# Patient Record
Sex: Male | Born: 1990 | Race: White | Hispanic: No | Marital: Married
Health system: Southern US, Community
[De-identification: ages and names within clinical notes are randomized; demographics above are authoritative.]

---

## 2018-09-04 ENCOUNTER — Emergency Department (HOSPITAL_COMMUNITY): Payer: No Typology Code available for payment source

## 2018-09-04 ENCOUNTER — Encounter (HOSPITAL_COMMUNITY): Payer: Self-pay

## 2018-09-04 ENCOUNTER — Other Ambulatory Visit: Payer: Self-pay

## 2018-09-04 ENCOUNTER — Emergency Department (HOSPITAL_COMMUNITY)
Admission: EM | Admit: 2018-09-04 | Discharge: 2018-09-04 | Disposition: A | Discharge status: Home / Self Care | Payer: No Typology Code available for payment source | Attending: Emergency Medicine | Admitting: Emergency Medicine

## 2018-09-04 DIAGNOSIS — R000 Tachycardia, unspecified: Secondary | ICD-10-CM | POA: Diagnosis not present

## 2018-09-04 DIAGNOSIS — S060X0A Concussion without loss of consciousness, initial encounter: Secondary | ICD-10-CM

## 2018-09-04 DIAGNOSIS — Y92410 Unspecified street and highway as the place of occurrence of the external cause: Secondary | ICD-10-CM | POA: Insufficient documentation

## 2018-09-04 DIAGNOSIS — Y999 Unspecified external cause status: Secondary | ICD-10-CM | POA: Insufficient documentation

## 2018-09-04 DIAGNOSIS — M79652 Pain in left thigh: Secondary | ICD-10-CM | POA: Diagnosis not present

## 2018-09-04 DIAGNOSIS — M25562 Pain in left knee: Secondary | ICD-10-CM | POA: Diagnosis not present

## 2018-09-04 DIAGNOSIS — Y9389 Activity, other specified: Secondary | ICD-10-CM | POA: Diagnosis not present

## 2018-09-04 DIAGNOSIS — S0101XA Laceration without foreign body of scalp, initial encounter: Secondary | ICD-10-CM

## 2018-09-04 DIAGNOSIS — M25552 Pain in left hip: Secondary | ICD-10-CM | POA: Diagnosis not present

## 2018-09-04 DIAGNOSIS — S0990XA Unspecified injury of head, initial encounter: Secondary | ICD-10-CM | POA: Diagnosis present

## 2018-09-04 LAB — COMPREHENSIVE METABOLIC PANEL
ALT: 24 U/L (ref 0–44)
AST: 32 U/L (ref 15–41)
Albumin: 4.6 g/dL (ref 3.5–5.0)
Alkaline Phosphatase: 37 U/L — ABNORMAL LOW (ref 38–126)
Anion gap: 13 (ref 5–15)
BUN: 13 mg/dL (ref 6–20)
CO2: 22 mmol/L (ref 22–32)
Calcium: 9.7 mg/dL (ref 8.9–10.3)
Chloride: 103 mmol/L (ref 98–111)
Creatinine, Ser: 0.99 mg/dL (ref 0.61–1.24)
GFR calc Af Amer: >60 mL/min (ref >60)
GFR calc non Af Amer: >60 mL/min (ref >60)
Glucose, Bld: 149 mg/dL — ABNORMAL HIGH (ref 70–99)
Potassium: 3.9 mmol/L (ref 3.5–5.1)
Sodium: 138 mmol/L (ref 135–145)
Total Bilirubin: 0.9 mg/dL (ref 0.3–1.2)
Total Protein: 7.7 g/dL (ref 6.5–8.1)

## 2018-09-04 LAB — CBC
HCT: 41.1 % (ref 39.0–52.0)
Hemoglobin: 14.4 g/dL (ref 13.0–17.0)
MCH: 30.8 pg (ref 26.0–34.0)
MCHC: 35 g/dL (ref 30.0–36.0)
MCV: 87.8 fL (ref 80.0–100.0)
Platelets: 271 10*3/uL (ref 150–400)
RBC: 4.68 MIL/uL (ref 4.22–5.81)
RDW: 11.8 % (ref 11.5–15.5)
WBC: 9.9 10*3/uL (ref 4.0–10.5)
nRBC: 0 % (ref 0.0–0.2)

## 2018-09-04 LAB — ETHANOL: Alcohol, Ethyl (B): <10 mg/dL (ref <10)

## 2018-09-04 MED ORDER — LIDOCAINE-EPINEPHRINE (PF) 2 %-1:200000 IJ SOLN
20.0000 mL | Freq: Once | INTRAMUSCULAR | Status: AC
  Administered 2018-09-04: 20 mL
  Filled 2018-09-04 (×2): qty 20

## 2018-09-04 MED ORDER — LACTATED RINGERS IV BOLUS
1000.0000 mL | Freq: Once | INTRAVENOUS | Status: AC
  Administered 2018-09-04: 1000 mL via INTRAVENOUS

## 2018-09-04 MED ORDER — FENTANYL CITRATE (PF) 100 MCG/2ML IJ SOLN: 50.0000 ug | INTRAMUSCULAR | Status: DC | PRN

## 2018-09-04 MED ORDER — ACETAMINOPHEN 325 MG PO TABS: 650.0000 mg | ORAL_TABLET | Freq: Four times a day (QID) | ORAL | 0 refills | Status: AC | PRN

## 2018-09-04 MED ORDER — IOHEXOL 300 MG/ML  SOLN
100.0000 mL | Freq: Once | INTRAMUSCULAR | Status: AC | PRN
  Administered 2018-09-04: 100 mL via INTRAVENOUS

## 2018-09-04 MED ORDER — OXYCODONE-ACETAMINOPHEN 5-325 MG PO TABS: 1.0000 | ORAL_TABLET | Freq: Three times a day (TID) | ORAL | 0 refills | Status: AC | PRN

## 2018-09-04 MED ORDER — FENTANYL CITRATE (PF) 100 MCG/2ML IJ SOLN
100.0000 ug | Freq: Once | INTRAMUSCULAR | Status: AC
  Administered 2018-09-04: 100 ug via INTRAVENOUS
  Filled 2018-09-04: qty 2

## 2018-09-04 MED ORDER — OXYCODONE-ACETAMINOPHEN 5-325 MG PO TABS
1.0000 | ORAL_TABLET | Freq: Once | ORAL | Status: AC
  Administered 2018-09-04: 1 via ORAL
  Filled 2018-09-04: qty 1

## 2018-09-04 NOTE — ED Notes (Signed)
Pt returned from ct

## 2018-09-04 NOTE — ED Notes (Signed)
Pt uncomfortable and wants to be repositioned but the reading on his c-ts have not been reported yet
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
## 2018-09-04 NOTE — ED Notes (Signed)
Fentanyl offered but pt does not want

## 2018-09-04 NOTE — ED Notes (Signed)
Pt back in c-t
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
## 2019-05-18 ENCOUNTER — Other Ambulatory Visit: Payer: Self-pay | Admitting: Orthopedic Surgery

## 2019-05-18 DIAGNOSIS — M259 Joint disorder, unspecified: Secondary | ICD-10-CM

## 2019-05-29 ENCOUNTER — Other Ambulatory Visit: Payer: Self-pay

## 2019-05-29 ENCOUNTER — Ambulatory Visit
Admission: RE | Admit: 2019-05-29 | Discharge: 2019-05-29 | Disposition: A | Discharge status: Home / Self Care | Payer: No Typology Code available for payment source | Source: Ambulatory Visit | Attending: Orthopedic Surgery | Admitting: Orthopedic Surgery

## 2019-05-29 DIAGNOSIS — M259 Joint disorder, unspecified: Secondary | ICD-10-CM

## 2019-07-08 ENCOUNTER — Other Ambulatory Visit: Payer: Self-pay | Admitting: Orthopedic Surgery

## 2019-07-08 DIAGNOSIS — G8929 Other chronic pain: Secondary | ICD-10-CM

## 2019-07-24 ENCOUNTER — Other Ambulatory Visit: Payer: Self-pay

## 2019-07-24 ENCOUNTER — Ambulatory Visit
Admission: RE | Admit: 2019-07-24 | Discharge: 2019-07-24 | Disposition: A | Discharge status: Home / Self Care | Payer: Worker's Compensation | Source: Ambulatory Visit | Attending: Orthopedic Surgery | Admitting: Orthopedic Surgery

## 2019-07-24 DIAGNOSIS — G8929 Other chronic pain: Secondary | ICD-10-CM

## 2019-07-24 MED ORDER — METHYLPREDNISOLONE ACETATE 40 MG/ML INJ SUSP (RADIOLOG
120.0000 mg | Freq: Once | INTRAMUSCULAR | Status: AC
  Administered 2019-07-24: 14:00:00 120 mg via INTRA_ARTICULAR

## 2019-07-24 MED ORDER — IOPAMIDOL (ISOVUE-M 200) INJECTION 41%
1.0000 mL | Freq: Once | INTRAMUSCULAR | Status: AC
  Administered 2019-07-24: 1 mL via INTRA_ARTICULAR

## 2019-07-24 NOTE — Discharge Instructions (Signed)
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
